# Patient Record
Sex: Male | Born: 1980 | Race: White | Hispanic: No | Marital: Single | Smoking: Never smoker
Health system: Southern US, Community
[De-identification: ages and names within clinical notes are randomized; demographics above are authoritative.]

## PROBLEM LIST (undated history)

## (undated) DIAGNOSIS — R000 Tachycardia, unspecified: Secondary | ICD-10-CM

## (undated) DIAGNOSIS — R002 Palpitations: Secondary | ICD-10-CM

## (undated) DIAGNOSIS — I10 Essential (primary) hypertension: Secondary | ICD-10-CM

## (undated) HISTORY — DX: Palpitations: R00.2

## (undated) HISTORY — DX: Tachycardia, unspecified: R00.0

## (undated) HISTORY — PX: WISDOM TOOTH EXTRACTION: SHX21

---

## 2009-11-29 ENCOUNTER — Ambulatory Visit: Payer: Self-pay | Admitting: Diagnostic Radiology

## 2009-11-29 ENCOUNTER — Emergency Department (HOSPITAL_BASED_OUTPATIENT_CLINIC_OR_DEPARTMENT_OTHER): Admission: EM | Admit: 2009-11-29 | Discharge: 2009-11-29 | Payer: Self-pay | Admitting: Emergency Medicine

## 2010-08-12 ENCOUNTER — Encounter: Admission: RE | Admit: 2010-08-12 | Discharge: 2010-08-12 | Payer: Self-pay | Admitting: Family Medicine

## 2010-10-14 ENCOUNTER — Encounter: Admission: RE | Admit: 2010-10-14 | Discharge: 2010-10-14 | Payer: Self-pay | Admitting: Specialist

## 2010-12-18 ENCOUNTER — Ambulatory Visit: Payer: Self-pay | Admitting: Cardiovascular Disease

## 2011-01-26 ENCOUNTER — Other Ambulatory Visit: Payer: Self-pay | Admitting: Ophthalmology

## 2011-01-26 DIAGNOSIS — H539 Unspecified visual disturbance: Secondary | ICD-10-CM

## 2011-02-03 ENCOUNTER — Ambulatory Visit
Admission: RE | Admit: 2011-02-03 | Discharge: 2011-02-03 | Disposition: A | Discharge status: Home / Self Care | Payer: Self-pay | Source: Ambulatory Visit | Attending: Ophthalmology | Admitting: Ophthalmology

## 2011-02-03 DIAGNOSIS — H539 Unspecified visual disturbance: Secondary | ICD-10-CM

## 2011-02-03 MED ORDER — GADOBENATE DIMEGLUMINE 529 MG/ML IV SOLN
15.0000 mL | Freq: Once | INTRAVENOUS | Status: AC | PRN
  Administered 2011-02-03: 15 mL via INTRAVENOUS

## 2011-03-15 LAB — DIFFERENTIAL
Basophils Absolute: 0 10*3/uL (ref 0.0–0.1)
Basophils Relative: 1 % (ref 0–1)
Eosinophils Absolute: 0.1 10*3/uL (ref 0.0–0.7)
Eosinophils Relative: 1 % (ref 0–5)
Lymphocytes Relative: 21 % (ref 12–46)
Lymphs Abs: 1.9 K/uL (ref 0.7–4.0)
Monocytes Absolute: 0.7 K/uL (ref 0.1–1.0)
Monocytes Relative: 8 % (ref 3–12)
Neutro Abs: 6.4 K/uL (ref 1.7–7.7)
Neutrophils Relative %: 70 % (ref 43–77)

## 2011-03-15 LAB — LIPASE, BLOOD: Lipase: 59 U/L (ref 23–300)

## 2011-03-15 LAB — URINALYSIS, ROUTINE W REFLEX MICROSCOPIC
Bilirubin Urine: NEGATIVE
Glucose, UA: NEGATIVE mg/dL
Hgb urine dipstick: NEGATIVE
Ketones, ur: NEGATIVE mg/dL
Nitrite: NEGATIVE
Protein, ur: NEGATIVE mg/dL
Specific Gravity, Urine: 1.012 (ref 1.005–1.030)
Urobilinogen, UA: 0.2 mg/dL (ref 0.0–1.0)
pH: 8 (ref 5.0–8.0)

## 2011-03-15 LAB — CBC
HCT: 45 % (ref 39.0–52.0)
Hemoglobin: 15.6 g/dL (ref 13.0–17.0)
MCHC: 34.8 g/dL (ref 30.0–36.0)
MCV: 90.6 fL (ref 78.0–100.0)
Platelets: 388 K/uL (ref 150–400)
RBC: 4.97 MIL/uL (ref 4.22–5.81)
RDW: 11.8 % (ref 11.5–15.5)
WBC: 9.1 K/uL (ref 4.0–10.5)

## 2011-03-15 LAB — COMPREHENSIVE METABOLIC PANEL WITH GFR
AST: 30 U/L (ref 0–37)
Albumin: 4.8 g/dL (ref 3.5–5.2)
BUN: 11 mg/dL (ref 6–23)
Calcium: 9.6 mg/dL (ref 8.4–10.5)
Chloride: 102 meq/L (ref 96–112)
Creatinine, Ser: 0.9 mg/dL (ref 0.4–1.5)
GFR calc Af Amer: >60 mL/min (ref >60)
Total Protein: 7.9 g/dL (ref 6.0–8.3)

## 2011-03-15 LAB — COMPREHENSIVE METABOLIC PANEL
ALT: 35 U/L (ref 0–53)
Alkaline Phosphatase: 71 U/L (ref 39–117)
CO2: 30 mEq/L (ref 19–32)
GFR calc non Af Amer: >60 mL/min (ref >60)
Glucose, Bld: 91 mg/dL (ref 70–99)
Potassium: 4.1 mEq/L (ref 3.5–5.1)
Sodium: 144 mEq/L (ref 135–145)
Total Bilirubin: 0.7 mg/dL (ref 0.3–1.2)

## 2011-08-29 IMAGING — CT CT HEAD W/O CM
5 of 6 series · 18 of 37 positions shown, 19 images · non-contrast
Comparison: None.

CT HEAD

CLINICAL DATA: Headache, neck pain, right hand tingling

CT HEAD WITHOUT CONTRAST
CT CERVICAL SPINE WITHOUT CONTRAST
TECHNIQUE: Multidetector CT imaging of the head and cervical spine
was performed following the standard protocol without intravenous
contrast.  Multiplanar CT image reconstructions of the cervical
spine were also generated.

[Series 2: head w/o · axial · non-contrast · 0.49mm/px · z∈[+73,+119]mm · 2 of 28 slices shown, 3 images]
[im 10/28  brain]
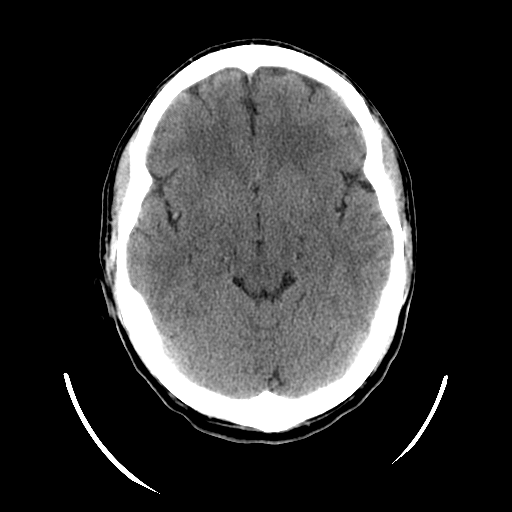
[im 10/28  bone]
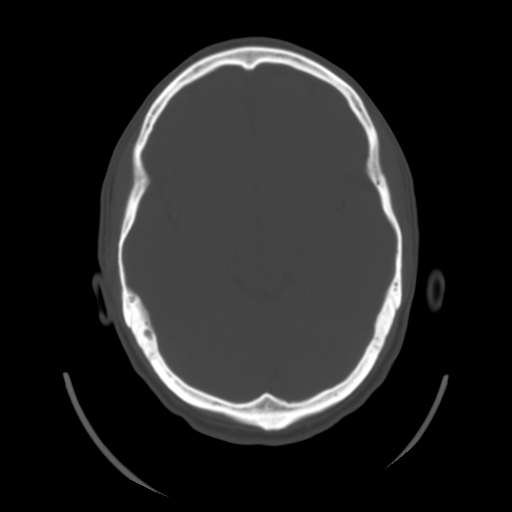
[im 19/28  brain]
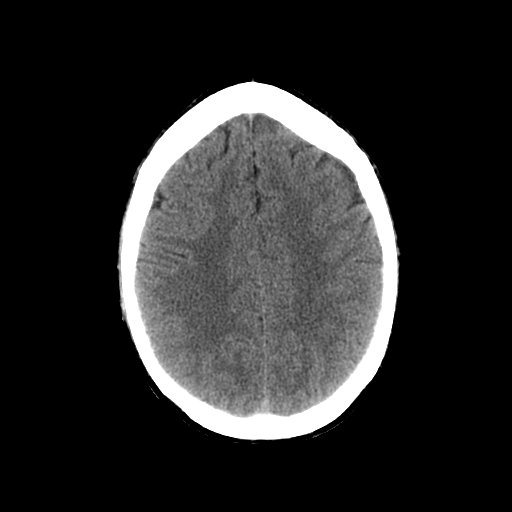

[Series 3: head bone · axial · 0.49mm/px · z∈[+73,+119]mm · 2 of 28 slices shown]
[im 10/28  bone]
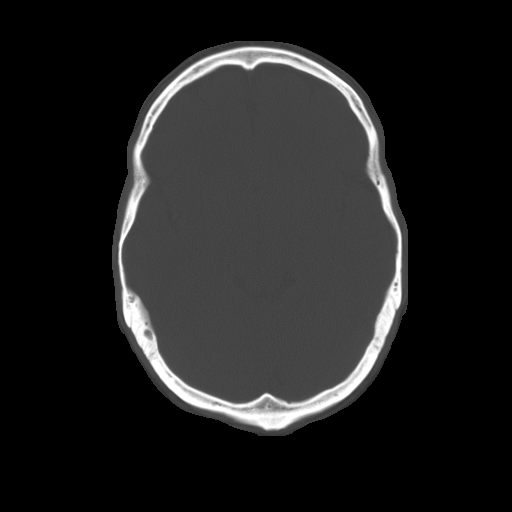
[im 19/28  bone]
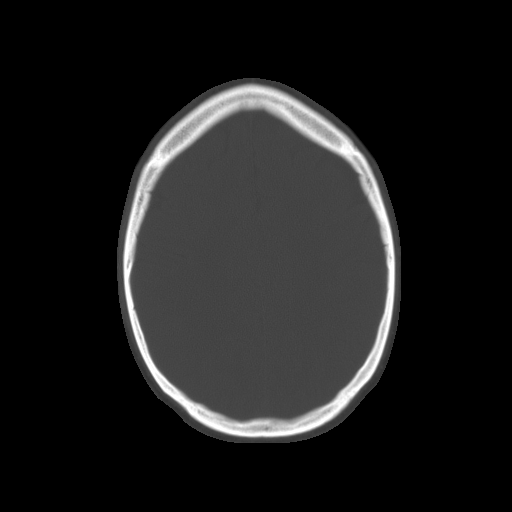

[Series 4: c spine bone · axial · 0.27mm/px · z∈[-166,-131]mm · 3 of 74 slices shown]
[im 8/74  bone]
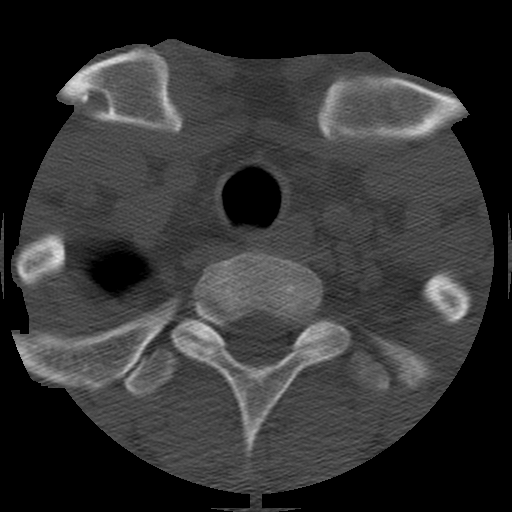
[im 15/74  bone]
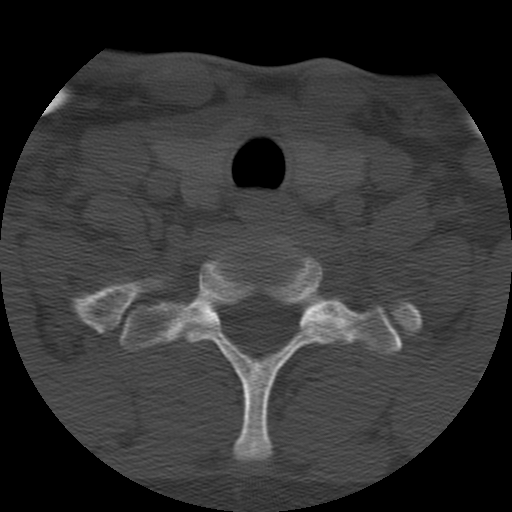
[im 22/74  bone]
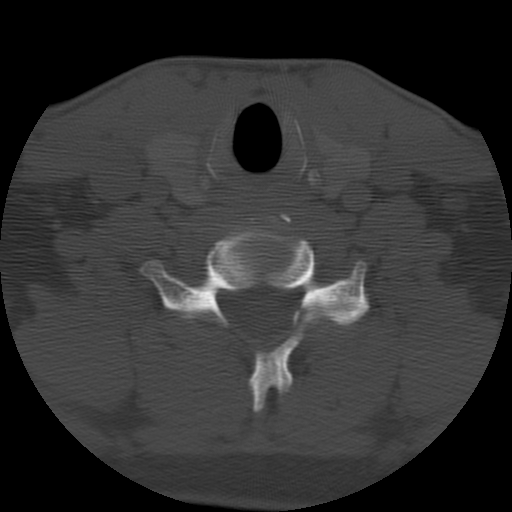

[Series 5: c spine soft · axial · 0.27mm/px · z∈[-166,-21]mm · 8 of 74 slices shown]
[im 8/74  brain]
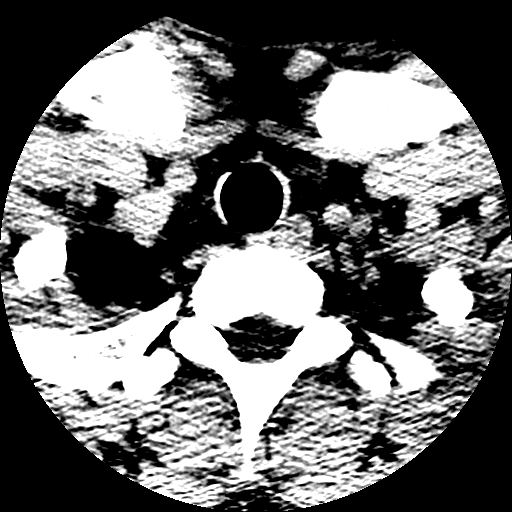
[im 15/74  brain]
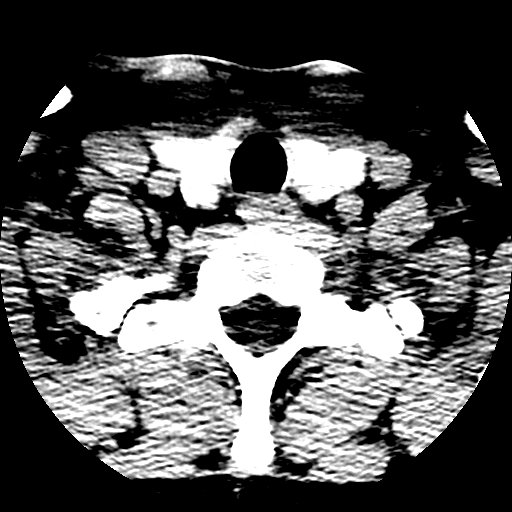
[im 22/74  brain]
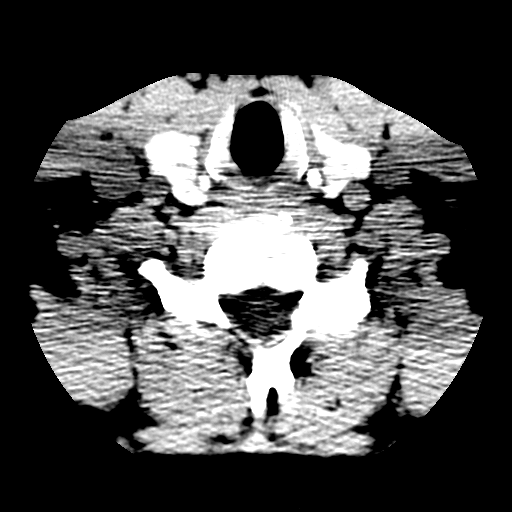
[im 30/74  brain]
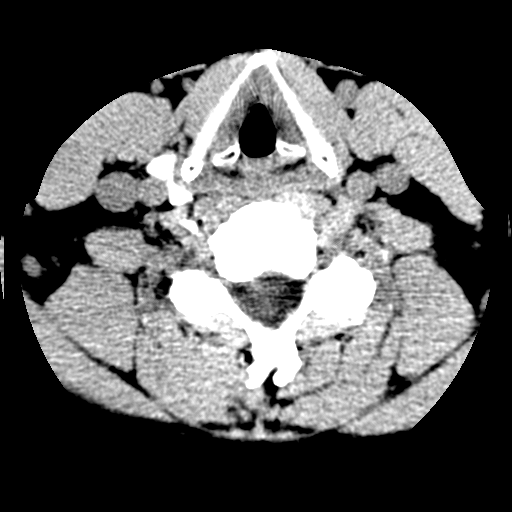
[im 44/74  brain]
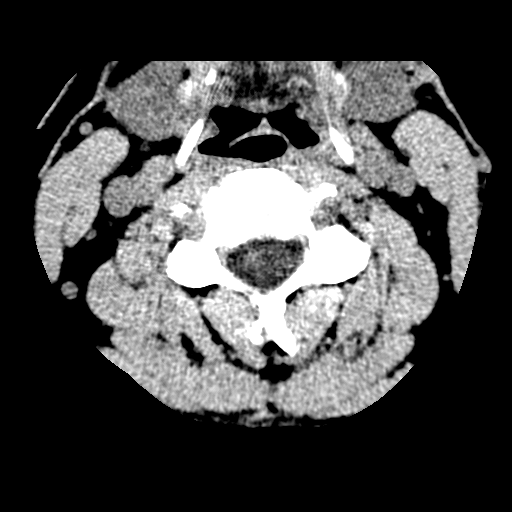
[im 52/74  brain]
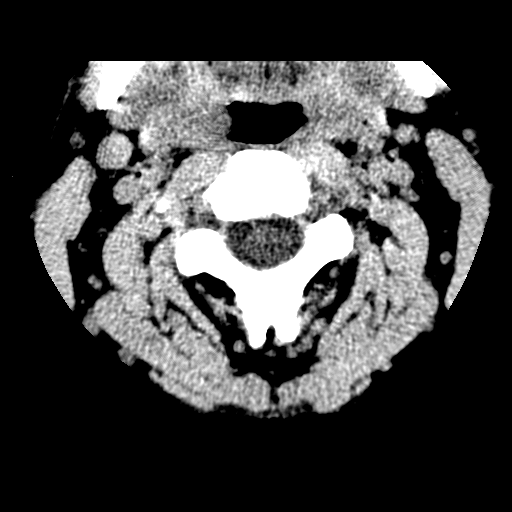
[im 59/74  brain]
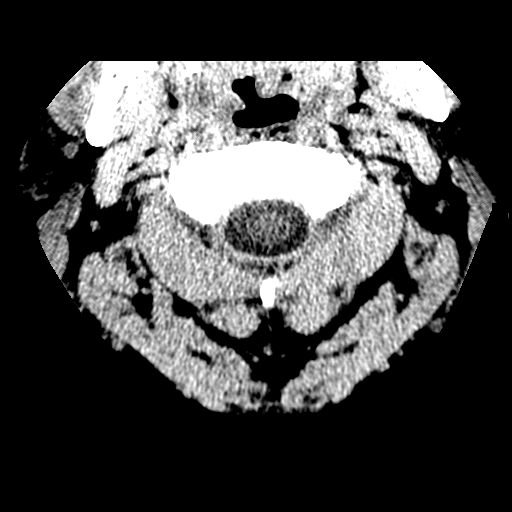
[im 66/74  brain]
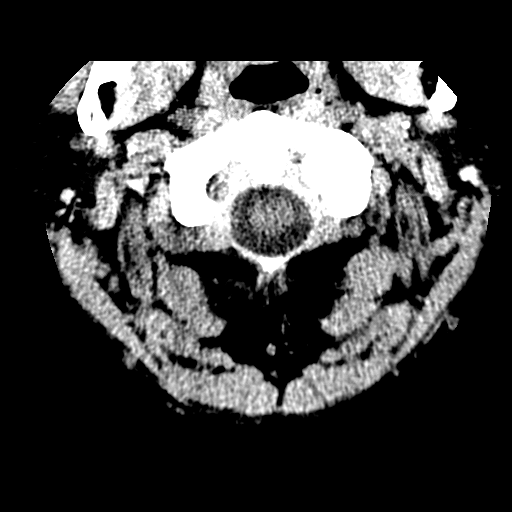

[Series 600: cor · coronal · 0.37mm/px · 3 of 37 slices shown]
[im 5/37  brain]
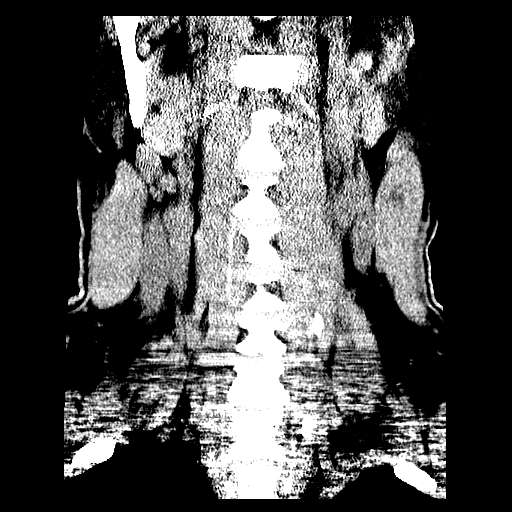
[im 9/37  brain]
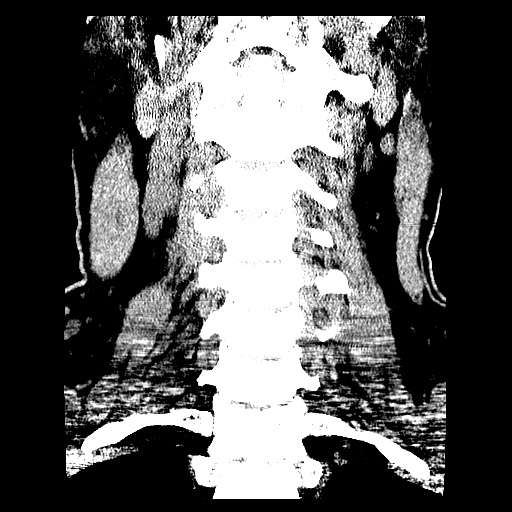
[im 12/37  brain]
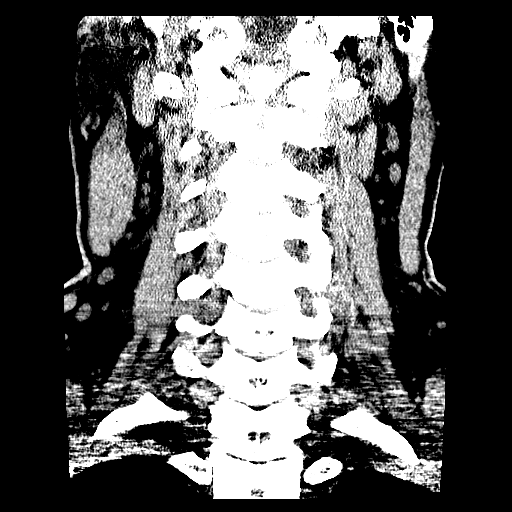

[18 of 37 positions shown; findings below may reference images not displayed]

FINDINGS: The ventricular system is normal in size and
configuration, and the septum is in a normal midline position.  The
fourth ventricle and basilar cisterns appear normal.  No
hemorrhage, masses, or acute infarction is seen.  On bone window
images, the mastoid air cells are well pneumatized, and the
paranasal sinuses are well pneumatized.  The internal auditory
canals appear symmetrical.  No calvarial abnormality is seen.
IMPRESSION: Negative unenhanced CT of the brain.

CT CERVICAL SPINE
FINDINGS: The cervical vertebrae are straightened in alignment.
Intervertebral disc spaces appear normal.  No prevertebral soft
tissue swelling is seen.  The odontoid process is intact.  No
cervical spine fracture is seen.  There is no evidence of central
canal or foraminal stenoses.  No definite herniated disc is seen by
CT.  The thyroid gland appears  normal.  No adenopathy is noted.
IMPRESSION: Straightened alignment of the cervical vertebrae.  No evidence of
degenerative disc disease.  No central canal or foraminal narrowing
is seen.

## 2012-03-21 ENCOUNTER — Encounter: Payer: Self-pay | Admitting: Cardiovascular Disease

## 2012-03-21 ENCOUNTER — Ambulatory Visit (INDEPENDENT_AMBULATORY_CARE_PROVIDER_SITE_OTHER): Payer: No Typology Code available for payment source | Admitting: Cardiovascular Disease

## 2012-03-21 VITALS — BP 135/50 | HR 74 | Ht 69.0 in | Wt 165.1 lb

## 2012-03-21 DIAGNOSIS — I10 Essential (primary) hypertension: Secondary | ICD-10-CM

## 2012-03-21 DIAGNOSIS — R002 Palpitations: Secondary | ICD-10-CM

## 2012-03-21 LAB — HEPATIC FUNCTION PANEL
Bilirubin, Direct: 0.1 mg/dL (ref 0.0–0.3)
Total Protein: 7.2 g/dL (ref 6.0–8.3)

## 2012-03-21 LAB — LIPID PANEL
HDL: 59.6 mg/dL (ref >39.00)
Triglycerides: 50 mg/dL (ref 0.0–149.0)

## 2012-03-21 LAB — BASIC METABOLIC PANEL
CO2: 28 mEq/L (ref 19–32)
Calcium: 9.1 mg/dL (ref 8.4–10.5)
Creatinine, Ser: 0.9 mg/dL (ref 0.4–1.5)

## 2012-03-21 LAB — LDL CHOLESTEROL, DIRECT: Direct LDL: 130.6 mg/dL

## 2012-03-21 MED ORDER — PROPRANOLOL HCL 10 MG PO TABS: 10.0000 mg | ORAL_TABLET | Freq: Four times a day (QID) | ORAL | Status: DC | PRN

## 2012-03-21 NOTE — Patient Instructions (Addendum)
Your physician wants you to follow-up in: 1 year You will receive a reminder letter in the mail two months in advance. If you don't receive a letter, please call our office to schedule the follow-up appointment.  Your physician recommends that you return for a FASTING lipid profile: today and in 1 year  Your physician has recommended you make the following change in your medication:   START PROPRANOLOL 10 MG TABLET FOR PALPITATIONS, TAKE ONE EVERY 30 MINUTES UP TO 4 DOSES.

## 2012-03-21 NOTE — Assessment & Plan Note (Signed)
I've refilled his propranolol 10 mg tablets. We'll see him again in one year.

## 2012-03-21 NOTE — Assessment & Plan Note (Signed)
Joseph Woodard is doing fairly well. I've asked him to start with a good low-salt diet. I've asked him to continue to exercise on a regular basis. I see him again in the office in one year for followup office visit and fasting labs.

## 2012-03-21 NOTE — Progress Notes (Signed)
    Joseph Woodard Date of Birth  Jan 02, 1981 [facility]     [place] Office  [address]    [address]   [address] [place], [place]  [address]    [place], [place]  [address] [phone]  Fax  [phone]  [phone]  Fax [phone]  Problem List: 1. Palpitations 2. Hypertension- mostly anxiety related.  History of Present Illness:  Joseph Woodard is a 31 yo with hx of palpitations.  He has had some mild elevations of his BP.  He has noticed some bilateral eye pain recently. He really doesn't pay much attention to what is eating. He typically eats a fairly normal diet for 31 year old.  Current Outpatient Prescriptions  Medication Sig Dispense Refill  . Multiple Vitamins-Minerals (MULTIVITAMIN PO) Take by mouth daily.      . propranolol (INDERAL) 10 MG tablet Take 1 tablet (10 mg total) by mouth 4 (four) times daily as needed (for palpitations ).  30 tablet  5     No Known Allergies  Past Medical History  Diagnosis Date  . Chest pain   . Palpitations     Past Surgical History  Procedure Date  . Wisdom tooth extraction     History  Smoking status  . Never Smoker   Smokeless tobacco  . Not on file    History  Alcohol Use  . Yes    Occas.    History reviewed. No pertinent family history.  Reviw of Systems:  Reviewed in the HPI.  All other systems are negative.  Physical Exam: Blood pressure 142/84, pulse 74, height 5\' 9"  (1.753 m), weight 165 lb 1.9 oz (74.898 kg). General: Well developed, well nourished, in no acute distress.  Head: Normocephalic, atraumatic, sclera non-icteric, mucus membranes are moist,   Neck: Supple. Carotids are 2 + without bruits. No JVD  Lungs: Clear bilaterally to auscultation.  Heart: regular rate.  normal  S1 S2. No murmurs, gallops or rubs.  Abdomen: Soft, non-tender, non-distended with normal bowel sounds. No hepatomegaly. No rebound/guarding. No masses.  Msk:  Strength and tone are  normal  Extremities: No clubbing or cyanosis. No edema.  Distal pedal pulses are 2+ and equal bilaterally.  Neuro: Alert and oriented X 3. Moves all extremities spontaneously.  Psych:  Responds to questions appropriately with a normal affect.  ECG: 03/21/2012-normal sinus rhythm. Right axis deviation. The EKG is otherwise normal.  Assessment / Plan:

## 2012-05-01 ENCOUNTER — Telehealth: Payer: Self-pay | Admitting: Cardiovascular Disease

## 2012-05-01 NOTE — Telephone Encounter (Signed)
lmtcb

## 2012-05-01 NOTE — Telephone Encounter (Signed)
Lmtcb//msg was left 03/27/12, this is first time he returned call.

## 2012-05-01 NOTE — Telephone Encounter (Signed)
New msg Pt wants to know blood work results

## 2012-05-01 NOTE — Telephone Encounter (Signed)
Please review phone numbers with him for update.

## 2012-05-09 NOTE — Telephone Encounter (Signed)
Fu call °Pt returning your call  °

## 2012-05-09 NOTE — Telephone Encounter (Signed)
Patient called with lab results. Pt verbalized understanding. [name] RN  

## 2012-08-25 ENCOUNTER — Encounter: Payer: Self-pay | Admitting: Cardiovascular Disease

## 2017-07-18 ENCOUNTER — Encounter: Payer: Self-pay | Admitting: Cardiovascular Disease

## 2017-08-03 ENCOUNTER — Encounter (INDEPENDENT_AMBULATORY_CARE_PROVIDER_SITE_OTHER): Payer: Self-pay

## 2017-08-03 ENCOUNTER — Encounter: Payer: Self-pay | Admitting: Cardiovascular Disease

## 2017-08-03 ENCOUNTER — Ambulatory Visit (INDEPENDENT_AMBULATORY_CARE_PROVIDER_SITE_OTHER): Payer: No Typology Code available for payment source | Admitting: Cardiovascular Disease

## 2017-08-03 VITALS — BP 160/90 | HR 85 | Ht 69.0 in | Wt 170.0 lb

## 2017-08-03 DIAGNOSIS — Z8249 Family history of ischemic heart disease and other diseases of the circulatory system: Secondary | ICD-10-CM

## 2017-08-03 DIAGNOSIS — R0602 Shortness of breath: Secondary | ICD-10-CM

## 2017-08-03 DIAGNOSIS — R002 Palpitations: Secondary | ICD-10-CM | POA: Diagnosis not present

## 2017-08-03 LAB — LIPID PANEL
CHOLESTEROL TOTAL: 201 mg/dL — AB (ref 100–199)
Chol/HDL Ratio: 2.5 ratio (ref 0.0–5.0)
HDL: 80 mg/dL (ref >39)
LDL CALC: 103 mg/dL — AB (ref 0–99)
Triglycerides: 90 mg/dL (ref 0–149)
VLDL CHOLESTEROL CAL: 18 mg/dL (ref 5–40)

## 2017-08-03 NOTE — Patient Instructions (Addendum)
Medication Instructions:  Your physician recommends that you continue on your current medications as directed. Please refer to the Current Medication list given to you today.   Labwork: TODAY - cholesterol   Testing/Procedures: Your physician has requested that you have an echocardiogram. Echocardiography is a painless test that uses sound waves to create images of your heart. It provides your doctor with information about the size and shape of your heart and how well your heart's chambers and valves are working. This procedure takes approximately one hour. There are no restrictions for this procedure.  Your physician has recommended that you wear an event monitor. Event monitors are medical devices that record the heart's electrical activity. Doctors most often [place] these monitors to diagnose arrhythmias. Arrhythmias are problems with the speed or rhythm of the heartbeat. The monitor is a small, portable device. You can wear one while you do your normal daily activities. This is usually used to diagnose what is causing palpitations/syncope (passing out).   Follow-Up: Your physician recommends that you schedule a follow-up appointment in: 3 months with Dr. Elease Hashimoto   If you need a refill on your cardiac medications before your next appointment, please call your pharmacy.   Thank you for choosing [facility]! Eligha Bridegroom, RN [phone]

## 2017-08-03 NOTE — Progress Notes (Signed)
Cardiology Office Note:    Date:  08/03/2017   ID:  [name], DOB 05-05-1981, MRN [id number]  PCP:  [facility] [place]  Cardiologist:  Kristeen Miss, MD    Referring MD: Joycelyn Rua, MD   1.  Palpitations 2. Family hx of CAD  Chief Complaint  Patient presents with  . New Patient (Initial Visit)    fatigue/hx of MI     History of Present Illness:    Joseph Woodard is a 36 y.o. male with a hx of palpitations. 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    Rides the [employer] twice a week.   No palps with exercise   palps occur in the evening .   Last 20 seconds Typically has several episodes per hour. ( sounds like PVCs)  LFTs are mildly elevated.   - drinks a decent amount of ETOH.   Past Medical History:  Diagnosis Date  . Chest pain   . Palpitations   . Tachycardia     Past Surgical History:  Procedure Laterality Date  . WISDOM TOOTH EXTRACTION      Current Medications: Current Meds  Medication Sig  . Multiple Vitamins-Minerals (MULTIVITAMIN PO) Take by mouth daily.     Allergies:   Patient has no known allergies.   Social History   Social History  . Marital status: Single    Spouse name: N/A  . Number of children: N/A  . Years of education: N/A   Social History Main Topics  . Smoking status: Never Smoker  . Smokeless tobacco: Never Used  . Alcohol use Yes     Comment: Occas.  . Drug use: No  . Sexual activity: Not Asked   Other Topics Concern  . None   Social History Narrative  . None     Family History: The patient's family history includes Heart attack in his maternal grandmother; Heart attack (age of onset: 25) in his father; Heart disease in his father and maternal grandmother; Parkinson's disease in his mother. ROS:   Please see the history of present illness.     All other systems reviewed and are negative.  EKGs/Labs/Other  Studies Reviewed:    EKG:  EKG is  ordered today.  The ekg ordered today demonstrates  NSR at 85.   No St or T wave changes.   Recent Labs: No results found for requested labs within last 8760 hours.  Recent Lipid Panel    Component Value Date/Time   CHOL 201 (H) 03/21/2012 1015   TRIG 50.0 03/21/2012 1015   HDL 59.60 03/21/2012 1015   CHOLHDL 3 03/21/2012 1015   VLDL 10.0 03/21/2012 1015   LDLDIRECT 130.6 03/21/2012 1015    Physical Exam:    VS:  BP (!) 160/90   Pulse 85   Ht 5\' 9"  (1.753 m)   Wt 170 lb (77.1 kg)   BMI 25.10 kg/m     Wt Readings from Last 3 Encounters:  08/03/17 170 lb (77.1 kg)  03/21/12 165 lb 1.9 oz (74.9 kg)     GEN:  Well nourished, well developed in no acute distress HEENT: Normal NECK: No JVD; No carotid bruits LYMPHATICS: No lymphadenopathy CARDIAC: RRR, no murmurs, rubs, gallops RESPIRATORY:  Clear to auscultation without rales, wheezing or rhonchi  ABDOMEN: Soft, non-tender, non-distended MUSCULOSKELETAL:  No edema; No deformity  SKIN: Warm and dry NEUROLOGIC:  Alert and  oriented x 3 PSYCHIATRIC:  Normal affect   ASSESSMENT:    1. SOB (shortness of breath)   2. Family history of early CAD   3. Palpitations    PLAN:    In order of problems listed above:  1. Palpitations: Success presents with palpitations that clinically sound like premature ventricular contractions. They occur at various times throughout the month.  He has occasional episodes of shortness of breath that seemed to be a little bit worse than they were before.  We'll place a 30 day event monitor for further evaluation of these palpitations. We'll get a neck car gram to assess his left ventricular function and valvular function.  His medical doctor has checked labs revealing a normal TSH and normal potassium level but did not check a lipid level. He has a very strong family history of any artery disease a we'll check lipid level today.  I'll see him again in 3 months  for follow-up visit.  Medication Adjustments/Labs and Tests Ordered: Current medicines are reviewed at length with the patient today.  Concerns regarding medicines are outlined above.  Orders Placed This Encounter  Procedures  . Lipid Profile  . Cardiac event monitor  . EKG 12-Lead  . ECHOCARDIOGRAM COMPLETE   No orders of the defined types were placed in this encounter.   Signed, Kristeen Miss, MD  08/03/2017 10:39 AM    [facility]

## 2017-08-23 ENCOUNTER — Other Ambulatory Visit: Payer: Self-pay

## 2017-08-23 ENCOUNTER — Ambulatory Visit (INDEPENDENT_AMBULATORY_CARE_PROVIDER_SITE_OTHER): Payer: No Typology Code available for payment source

## 2017-08-23 ENCOUNTER — Ambulatory Visit (HOSPITAL_COMMUNITY): Payer: No Typology Code available for payment source | Attending: Cardiology

## 2017-08-23 DIAGNOSIS — R002 Palpitations: Secondary | ICD-10-CM | POA: Diagnosis not present

## 2017-08-23 DIAGNOSIS — R0602 Shortness of breath: Secondary | ICD-10-CM | POA: Diagnosis present

## 2017-11-09 ENCOUNTER — Telehealth: Payer: Self-pay | Admitting: Cardiovascular Disease

## 2017-11-09 NOTE — Telephone Encounter (Signed)
Patient's mother called to reschedule due to insurance reasons. Patient is rescheduled to see Dr. Elease HashimotoNahser on 01/20/18. She is in agreement with plan and thanked me for the call.

## 2017-11-09 NOTE — Telephone Encounter (Signed)
New message     Does patient need to keep appt on Monday 12/3 he was told all his tests were normal, if he does need this appt he need to move it to January due to insurance reasons

## 2017-11-14 ENCOUNTER — Ambulatory Visit: Payer: No Typology Code available for payment source | Admitting: Cardiovascular Disease

## 2018-01-20 ENCOUNTER — Ambulatory Visit: Payer: No Typology Code available for payment source | Admitting: Cardiovascular Disease

## 2018-06-06 ENCOUNTER — Other Ambulatory Visit (HOSPITAL_BASED_OUTPATIENT_CLINIC_OR_DEPARTMENT_OTHER): Payer: Self-pay | Admitting: Physician Assistant

## 2018-06-06 DIAGNOSIS — R1084 Generalized abdominal pain: Secondary | ICD-10-CM

## 2018-06-07 ENCOUNTER — Ambulatory Visit (HOSPITAL_BASED_OUTPATIENT_CLINIC_OR_DEPARTMENT_OTHER)
Admission: RE | Admit: 2018-06-07 | Discharge: 2018-06-07 | Disposition: A | Discharge status: Home / Self Care | Payer: No Typology Code available for payment source | Source: Ambulatory Visit | Attending: Physician Assistant | Admitting: Physician Assistant

## 2018-06-07 DIAGNOSIS — R1084 Generalized abdominal pain: Secondary | ICD-10-CM

## 2018-06-07 DIAGNOSIS — K760 Fatty (change of) liver, not elsewhere classified: Secondary | ICD-10-CM | POA: Diagnosis not present

## 2020-07-03 ENCOUNTER — Other Ambulatory Visit: Payer: Self-pay | Admitting: Family Medicine

## 2020-07-04 ENCOUNTER — Other Ambulatory Visit: Payer: Self-pay | Admitting: Family Medicine

## 2020-07-04 DIAGNOSIS — R109 Unspecified abdominal pain: Secondary | ICD-10-CM

## 2020-07-04 DIAGNOSIS — R195 Other fecal abnormalities: Secondary | ICD-10-CM

## 2020-07-21 ENCOUNTER — Other Ambulatory Visit: Payer: No Typology Code available for payment source

## 2020-07-29 ENCOUNTER — Other Ambulatory Visit: Payer: 59

## 2020-08-08 ENCOUNTER — Ambulatory Visit
Admission: RE | Admit: 2020-08-08 | Discharge: 2020-08-08 | Disposition: A | Discharge status: Home / Self Care | Payer: 59 | Source: Ambulatory Visit | Attending: Family Medicine | Admitting: Family Medicine

## 2020-08-08 DIAGNOSIS — R109 Unspecified abdominal pain: Secondary | ICD-10-CM

## 2020-08-08 DIAGNOSIS — R195 Other fecal abnormalities: Secondary | ICD-10-CM

## 2020-08-08 MED ORDER — IOPAMIDOL (ISOVUE-300) INJECTION 61%
100.0000 mL | Freq: Once | INTRAVENOUS | Status: AC | PRN
  Administered 2020-08-08: 100 mL via INTRAVENOUS

## 2021-05-26 ENCOUNTER — Emergency Department (HOSPITAL_BASED_OUTPATIENT_CLINIC_OR_DEPARTMENT_OTHER)
Admission: EM | Admit: 2021-05-26 | Discharge: 2021-05-27 | Disposition: A | Discharge status: Home / Self Care | Payer: 59 | Attending: Emergency Medicine | Admitting: Emergency Medicine

## 2021-05-26 ENCOUNTER — Other Ambulatory Visit: Payer: Self-pay

## 2021-05-26 DIAGNOSIS — R5383 Other fatigue: Secondary | ICD-10-CM | POA: Insufficient documentation

## 2021-05-26 DIAGNOSIS — R079 Chest pain, unspecified: Secondary | ICD-10-CM | POA: Insufficient documentation

## 2021-05-26 DIAGNOSIS — M542 Cervicalgia: Secondary | ICD-10-CM | POA: Insufficient documentation

## 2021-05-26 DIAGNOSIS — I10 Essential (primary) hypertension: Secondary | ICD-10-CM | POA: Insufficient documentation

## 2021-05-26 DIAGNOSIS — R0602 Shortness of breath: Secondary | ICD-10-CM | POA: Diagnosis not present

## 2021-05-26 DIAGNOSIS — Z79899 Other long term (current) drug therapy: Secondary | ICD-10-CM | POA: Insufficient documentation

## 2021-05-26 HISTORY — DX: Essential (primary) hypertension: I10

## 2021-05-27 ENCOUNTER — Other Ambulatory Visit: Payer: Self-pay

## 2021-05-27 ENCOUNTER — Encounter (HOSPITAL_BASED_OUTPATIENT_CLINIC_OR_DEPARTMENT_OTHER): Payer: Self-pay | Admitting: *Deleted

## 2021-05-27 ENCOUNTER — Emergency Department (HOSPITAL_BASED_OUTPATIENT_CLINIC_OR_DEPARTMENT_OTHER): Payer: 59 | Admitting: Radiology

## 2021-05-27 LAB — BASIC METABOLIC PANEL
Anion gap: 14 (ref 5–15)
BUN: 8 mg/dL (ref 6–20)
CO2: 23 mmol/L (ref 22–32)
Calcium: 9.2 mg/dL (ref 8.9–10.3)
Chloride: 103 mmol/L (ref 98–111)
Creatinine, Ser: 0.64 mg/dL (ref 0.61–1.24)
GFR, Estimated: >60 mL/min (ref >60)
Glucose, Bld: 98 mg/dL (ref 70–99)
Potassium: 3.7 mmol/L (ref 3.5–5.1)
Sodium: 140 mmol/L (ref 135–145)

## 2021-05-27 LAB — CBC WITH DIFFERENTIAL/PLATELET
Abs Immature Granulocytes: 0.06 10*3/uL (ref 0.00–0.07)
Basophils Absolute: 0.1 10*3/uL (ref 0.0–0.1)
Basophils Relative: 1 %
Eosinophils Absolute: 0.2 10*3/uL (ref 0.0–0.5)
Eosinophils Relative: 2 %
HCT: 41.9 % (ref 39.0–52.0)
Hemoglobin: 15.1 g/dL (ref 13.0–17.0)
Immature Granulocytes: 1 %
Lymphocytes Relative: 27 %
Lymphs Abs: 2.7 10*3/uL (ref 0.7–4.0)
MCH: 32.4 pg (ref 26.0–34.0)
MCHC: 36 g/dL (ref 30.0–36.0)
MCV: 89.9 fL (ref 80.0–100.0)
Monocytes Absolute: 1.2 10*3/uL — ABNORMAL HIGH (ref 0.1–1.0)
Monocytes Relative: 13 %
Neutro Abs: 5.5 10*3/uL (ref 1.7–7.7)
Neutrophils Relative %: 56 %
Platelets: 325 10*3/uL (ref 150–400)
RBC: 4.66 MIL/uL (ref 4.22–5.81)
RDW: 11.4 % — ABNORMAL LOW (ref 11.5–15.5)
WBC: 9.7 10*3/uL (ref 4.0–10.5)
nRBC: 0 % (ref 0.0–0.2)

## 2021-05-27 LAB — TROPONIN I (HIGH SENSITIVITY): Troponin I (High Sensitivity): 2 ng/L (ref <18)

## 2021-05-27 LAB — D-DIMER, QUANTITATIVE: D-Dimer, Quant: <0.27 ug/mL-FEU (ref 0.00–0.50)

## 2021-05-27 NOTE — ED Triage Notes (Signed)
Pt c/o anterior chest pain that started one week ago. Was evaluated by EMS-Ekg done-and pt followed up with his MD this past week. Describes pain as sharp and comes and goes. Has not taken meds for same. Also c/o posterior neck pain that radiates into his posterior aspect of his head. C/o dizziness when he was leaning over. Denies sob.

## 2021-05-27 NOTE — Discharge Instructions (Addendum)
Follow-up with your primary care doctor or cardiologist.  Return here as needed for any worsening symptoms.

## 2021-05-27 NOTE — ED Provider Notes (Signed)
[facility] EMERGENCY DEPT Provider Note   CSN: [id number] Arrival date & time: 05/26/21  2350     History Chief Complaint  Patient presents with   Chest Pain    Joseph Woodard is a 40 y.o. male.  Patient is a 40 year old male who presents with chest pain.  He states that about 5 days ago he had an episode of sharp chest pain to the left parasternal area.  It lasted about 20 minutes or so.  He he had some associated shortness of breath but he was anxious at the time to and does not know if the shortness of breath was more from the anxiety.  EMS was called and said that his EKG looked okay but recommended transport to the ED.  He declined at that time but followed up with his PCP the next day.  He was noted to have some elevation in his blood pressure and was started on antihypertensive medication.  Since that time he has had some very mild intermittent chest pains around his chest but not in the same location.  He currently denies any chest pain.  He says he has felt fatigued for about the last month.  At times he feels short of breath when he is climbing stairs or other such things.  He denies any current shortness of breath.  He had some pain in his neck over the last couple days.  Its in the right side of his neck.  It starts at the base of his head and radiates down toward his right shoulder.  There is no radiation down his arm.  No numbness or weakness in his hand.  He says he mostly notices it when he is laying down or sitting in a recliner chair or laying back.  When he sits up and walks around, it goes away.  No pain around his shoulder blades or other parts of his back.  No leg pain or swelling.  No cough or cold symptoms.  No prior history of heart disease.  He was seen by cardiology in 2018 for palpitations and was briefly started on metoprolol but subsequently discontinued it.  He does have a family history of early heart disease in his father who had a heart attack at age  57.      Past Medical History:  Diagnosis Date   Chest pain    Hypertension    Palpitations    Tachycardia     Patient Active Problem List   Diagnosis Date Noted   Palpitations 03/21/2012   HTN (hypertension) 03/21/2012    Past Surgical History:  Procedure Laterality Date   WISDOM TOOTH EXTRACTION         Family History  Problem Relation Age of Onset   Parkinson's disease Mother    Heart attack Father 34   Heart disease Father    Heart attack Maternal Grandmother    Heart disease Maternal Grandmother     Social History   Tobacco Use   Smoking status: Never   Smokeless tobacco: Never  Vaping Use   Vaping Use: Never used  Substance Use Topics   Alcohol use: Yes    Alcohol/week: 3.0 standard drinks    Types: 3 Cans of beer per week    Comment: 3 times weekly   Drug use: No    Home Medications Prior to Admission medications   Medication Sig Start Date End Date Taking? Authorizing Provider  AMLODIPINE BESYLATE PO Take by mouth.   Yes [provider]  Multiple Vitamins-Minerals (MULTIVITAMIN PO) Take by mouth daily.    [provider]    Allergies    Patient has no known allergies.  Review of Systems   Review of Systems  Constitutional:  Negative for chills, diaphoresis, fatigue and fever.  HENT:  Negative for congestion, rhinorrhea and sneezing.   Eyes: Negative.   Respiratory:  Positive for shortness of breath. Negative for cough and chest tightness.   Cardiovascular:  Positive for chest pain. Negative for leg swelling.  Gastrointestinal:  Negative for abdominal pain, blood in stool, diarrhea, nausea and vomiting.  Genitourinary:  Negative for difficulty urinating, flank pain, frequency and hematuria.  Musculoskeletal:  Positive for neck pain. Negative for arthralgias and back pain.  Skin:  Negative for rash.  Neurological:  Negative for dizziness, speech difficulty, weakness, numbness and headaches.   Physical Exam Updated Vital  Signs BP (!) 125/99   Pulse 98   Temp 98.3 F (36.8 C) (Oral)   Resp (!) 21   Ht 5\' 9"  (1.753 m)   Wt 77.1 kg   SpO2 98%   BMI 25.10 kg/m   Physical Exam Constitutional:      Appearance: He is well-developed.  HENT:     Head: Normocephalic and atraumatic.  Eyes:     Pupils: Pupils are equal, round, and reactive to light.  Neck:     Comments: Minor tenderness to the right paraspinal area in the cervical region.  No bony tenderness.  No pain over the trapezius muscle.  He has normal sensation and motor function to the upper extremities bilaterally.  Radial pulses are intact. Cardiovascular:     Rate and Rhythm: Normal rate and regular rhythm.     Heart sounds: Normal heart sounds.  Pulmonary:     Effort: Pulmonary effort is normal. No respiratory distress.     Breath sounds: Normal breath sounds. No wheezing or rales.  Chest:     Chest wall: No tenderness.  Abdominal:     General: Bowel sounds are normal.     Palpations: Abdomen is soft.     Tenderness: There is no abdominal tenderness. There is no guarding or rebound.  Musculoskeletal:        General: Normal range of motion.     Cervical back: Normal range of motion and neck supple.  Lymphadenopathy:     Cervical: No cervical adenopathy.  Skin:    General: Skin is warm and dry.     Findings: No rash.  Neurological:     Mental Status: He is alert and oriented to person, place, and time.    ED Results / Procedures / Treatments   Labs (all labs ordered are listed, but only abnormal results are displayed) Labs Reviewed  CBC WITH DIFFERENTIAL/PLATELET - Abnormal; Notable for the following components:      Result Value   RDW 11.4 (*)    Monocytes Absolute 1.2 (*)    All other components within normal limits  BASIC METABOLIC PANEL  D-DIMER, QUANTITATIVE  TROPONIN I (HIGH SENSITIVITY)    EKG EKG Interpretation  Date/Time:  Wednesday May 27 2021 00:03:17 EDT Ventricular Rate:  105 PR Interval:  120 QRS  Duration: 102 QT Interval:  338 QTC Calculation: 446 R Axis:   114 Text Interpretation: Sinus tachycardia Left posterior fascicular block Abnormal ECG Confirmed by [phone] on 05/27/2021 12:37:59 AM  Radiology DG Chest 2 View  Result Date: 05/27/2021 CLINICAL DATA:  Chest pain starting 1 week ago. EXAM:  CHEST - 2 VIEW COMPARISON:  None. FINDINGS: The heart size and mediastinal contours are within normal limits. Both lungs are clear. The visualized skeletal structures are unremarkable. IMPRESSION: No active cardiopulmonary disease. Electronically Signed   By: Burman Nieves M.D.   On: 05/27/2021 01:21    Procedures Procedures   Medications Ordered in ED Medications - No data to display  ED Course  I have reviewed the triage vital signs and the nursing notes.  Pertinent labs & imaging results that were available during my care of the patient were reviewed by me and considered in my medical decision making (see chart for details).    MDM Rules/Calculators/A&P                          Patient is a 40 year old male who presented with chest pain.  His main episode was thought 5 days ago but he has had some intermittent pain since then.  He has no exertional symptoms.  He was concerned because he had some right-sided neck pain.  Its intermittent.  It seems to be positional.  He has no neurologic deficits.  Seems to be related to his cervical spine.  He does not have any other symptoms that sound more concerning for dissection.  His D-dimer is negative and he has no other symptoms that would be more concerning for PE.  He had no ischemic changes on his EKG.  Chest x-ray is clear without evidence of pneumothorax or pneumonia.  No pulmonary edema.  His labs are nonconcerning.  His troponin is negative.  Given the timing of his symptoms, I did not feel that a repeat troponin is indicated.  His primary care provider has seen him this week for chest pain and referred him to cardiology.   They are supposed to be scheduling him an appointment.  He was discharged home in good condition.  Return precautions were given. Final Clinical Impression(s) / ED Diagnoses Final diagnoses:  Chest pain, unspecified type    Rx / DC Orders ED Discharge Orders     None        Rolan Bucco, MD 05/27/21 [phone]

## 2021-06-24 ENCOUNTER — Ambulatory Visit (INDEPENDENT_AMBULATORY_CARE_PROVIDER_SITE_OTHER): Payer: 59

## 2021-06-24 ENCOUNTER — Encounter: Payer: Self-pay | Admitting: Orthopaedic Surgery

## 2021-06-24 ENCOUNTER — Other Ambulatory Visit: Payer: Self-pay

## 2021-06-24 ENCOUNTER — Ambulatory Visit (INDEPENDENT_AMBULATORY_CARE_PROVIDER_SITE_OTHER): Payer: 59 | Admitting: Orthopaedic Surgery

## 2021-06-24 DIAGNOSIS — M542 Cervicalgia: Secondary | ICD-10-CM | POA: Diagnosis not present

## 2021-06-24 DIAGNOSIS — M25511 Pain in right shoulder: Secondary | ICD-10-CM

## 2021-06-24 MED ORDER — METHOCARBAMOL 500 MG PO TABS: 500.0000 mg | ORAL_TABLET | Freq: Four times a day (QID) | ORAL | 1 refills | Status: AC | PRN

## 2021-06-24 MED ORDER — METHYLPREDNISOLONE ACETATE 40 MG/ML IJ SUSP
40.0000 mg | INTRAMUSCULAR | Status: AC | PRN
  Administered 2021-06-24: 40 mg via INTRAMUSCULAR

## 2021-06-24 MED ORDER — MELOXICAM 7.5 MG PO TABS: 7.5000 mg | ORAL_TABLET | Freq: Two times a day (BID) | ORAL | 0 refills | Status: DC | PRN

## 2021-06-24 MED ORDER — LIDOCAINE HCL 1 % IJ SOLN
1.0000 mL | INTRAMUSCULAR | Status: AC | PRN
  Administered 2021-06-24: 1 mL

## 2021-06-24 NOTE — Progress Notes (Signed)
Office Visit Note   Patient: Joseph Woodard           Date of Birth: 1981/04/07           MRN: [id number] Visit Date: 06/24/2021              Requested by: Joycelyn Rua, MD [address] [place],  [place] [address] PCP: Joycelyn Rua, MD   Assessment & Plan: Visit Diagnoses:  1. Neck pain   2. Trigger point of right shoulder region     Plan: I spoke to him in length about what is going on with his neck.  I did feel that a trigger point injection would be worthwhile with a steroid and I explained the rationale behind this as well as the risk and benefits.  I did place a steroid injection in the right parascapular area which he tolerated well.  I would like to try Robaxin and meloxicam.  He does not have time for physical therapy at the moment due to things going on his life but that is something consider later.  All questions and concerns were answered and addressed.  I would like to see him back in 4 weeks.  Follow-Up Instructions: Return in about 4 weeks (around 07/22/2021).   Orders:  Orders Placed This Encounter  Procedures   Trigger Point Inj   XR Cervical Spine 2 or 3 views   Meds ordered this encounter  Medications   methocarbamol (ROBAXIN) 500 MG tablet    Sig: Take 1 tablet (500 mg total) by mouth every 6 (six) hours as needed.    Dispense:  40 tablet    Refill:  1   meloxicam (MOBIC) 7.5 MG tablet    Sig: Take 1 tablet (7.5 mg total) by mouth 2 (two) times daily between meals as needed for pain.    Dispense:  60 tablet    Refill:  0      Procedures: Trigger Point Inj  Date/Time: 06/24/2021 10:45 AM Performed by: Kathryne Hitch, MD Authorized by: Kathryne Hitch, MD   Indications:  Pain Total # of Trigger Points:  1 Location: shoulder   Medications #1:  1 mL lidocaine 1 %; 40 mg methylPREDNISolone acetate 40 MG/ML   Clinical Data: No additional findings.   Subjective: Chief Complaint  Patient presents with   Neck - Pain   The patient is a very pleasant 40 year old gentleman who comes in with 5 years of worsening chronic right-sided neck pain.  He denies any numbness and tingling going down to his hand but it does radiate slightly into the shoulder but is more in the occipital area and the lateral aspect of his right neck where he gets stiff and painful.  It happens most at night.  He denies any specific injuries.  He says it may be work-related in terms of spending a long amount of time on a computer.  He can be nausea related as well and even dealing with life stressors.  He does have a sister with stage IV cancer and a mother who is dealing with Parkinson's disease and both of those people are hospitalized.  He does report an odd numbness and tingling that occurs occasionally and he points to the sternocleidomastoid area as the area of his pain.  He denies any breathing issues.  He is not a smoker and not a diabetic.  HPI  Review of Systems There is currently listed no headache, chest pain, shortness of breath, fever,  chills, nausea, vomiting  Objective: Vital Signs: There were no vitals taken for this visit.  Physical Exam There is no acute distress on exam.  He is alert and orient x3. Ortho Exam On examination he does have stiffness with lateral rotation and bending to the right side.  He has a negative Spurling sign.  He has 5 out of 5 strength of all muscle groups of the bilateral upper extremities and normal sensation in all dermatomes.  He does have trigger point pain just posterior to the mid trapezius area.  There is a radicular component of this as well. Specialty Comments:  No specialty comments available.  Imaging: XR Cervical Spine 2 or 3 views  Result Date: 06/24/2021 2 views of the cervical spine show loss of cervical lordosis.  The disc heights are otherwise well-maintained.  There is no acute findings other than the loss of cervical lordosis.    PMFS History: Patient Active Problem List    Diagnosis Date Noted   Palpitations 03/21/2012   HTN (hypertension) 03/21/2012   Past Medical History:  Diagnosis Date   Chest pain    Hypertension    Palpitations    Tachycardia     Family History  Problem Relation Age of Onset   Parkinson's disease Mother    Heart attack Father 53   Heart disease Father    Heart attack Maternal Grandmother    Heart disease Maternal Grandmother     Past Surgical History:  Procedure Laterality Date   WISDOM TOOTH EXTRACTION     Social History   Occupational History   Not on file  Tobacco Use   Smoking status: Never   Smokeless tobacco: Never  Vaping Use   Vaping Use: Never used  Substance and Sexual Activity   Alcohol use: Yes    Alcohol/week: 3.0 standard drinks    Types: 3 Cans of beer per week    Comment: 3 times weekly   Drug use: No   Sexual activity: Not on file

## 2021-07-07 ENCOUNTER — Ambulatory Visit: Payer: 59 | Admitting: Cardiology

## 2021-07-18 ENCOUNTER — Other Ambulatory Visit: Payer: Self-pay | Admitting: Orthopaedic Surgery

## 2021-07-20 NOTE — Telephone Encounter (Signed)
ok 

## 2021-07-22 ENCOUNTER — Ambulatory Visit: Payer: 59 | Admitting: Orthopaedic Surgery

## 2021-08-12 ENCOUNTER — Encounter: Payer: Self-pay | Admitting: Orthopaedic Surgery

## 2021-08-12 ENCOUNTER — Ambulatory Visit (INDEPENDENT_AMBULATORY_CARE_PROVIDER_SITE_OTHER): Payer: 59 | Admitting: Orthopaedic Surgery

## 2021-08-12 DIAGNOSIS — M25511 Pain in right shoulder: Secondary | ICD-10-CM | POA: Diagnosis not present

## 2021-08-12 DIAGNOSIS — M542 Cervicalgia: Secondary | ICD-10-CM

## 2021-08-12 NOTE — Progress Notes (Signed)
Office Visit Note   Patient: Joseph Woodard           Date of Birth: 12-09-1981           MRN: [id number] Visit Date: 08/12/2021              Requested by: Joycelyn Rua, MD [address] [place],  [place] [address] PCP: Joycelyn Rua, MD   Assessment & Plan: Visit Diagnoses:  1. Neck pain   2. Trigger point of right shoulder region     Plan: We will hold off on repeat injection at this point time.  He is agreeable to trying to get daily oral therapy.  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  We will send him to physical therapy for range of motion and neck shoulders, modalities and home exercise program.  He may need a repeat trigger point injection in the future and he will let us know.  Questions were encouraged and answered at length.  Follow-Up Instructions: No follow-ups on file.   Orders:  No orders of the defined types were placed in this encounter.  No orders of the defined types were placed in this encounter.     Procedures: No procedures performed   Clinical Data: No additional findings.   Subjective: Chief Complaint  Patient presents with   Neck - Follow-up    HPI Joseph Woodard is a 40 year old male who is following up for right shoulder trigger point pain and neck pain.  He does feel the trigger point injection on 06/24/2021 was very beneficial.  He is having some hurting again in the shoulder but not to the intensity he was having prior to the injection.  Since he was seen his sister did pass away.  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  Denies any numbness tingling down either arm.  Discussed with him the importance of ergonomics and his work as he does spend a lot of time on a computer. Review of Systems See HPI otherwise negative  Objective: Vital Signs: There were no vitals taken for  this visit.  Physical Exam General: Well-developed well-nourished male no acute distress mood and affect affect appropriate Psych: Alert and oriented x3 Ortho Exam Cervical spine is good range of motion cervical spine with rotation to the left causes pain in the right trapezius region.  He has a point of maximal tenderness palpation in the trapezius region of the right shoulder only. Specialty Comments:  No specialty comments available.  Imaging: No results found.   PMFS History: Patient Active Problem List   Diagnosis Date Noted   Palpitations 03/21/2012   HTN (hypertension) 03/21/2012   Past Medical History:  Diagnosis Date   Chest pain    Hypertension    Palpitations    Tachycardia     Family History  Problem Relation Age of Onset   Parkinson's disease Mother    Heart attack Father 62   Heart disease Father    Heart attack Maternal Grandmother    Heart disease Maternal Grandmother     Past Surgical History:  Procedure Laterality Date   WISDOM TOOTH EXTRACTION     Social History   Occupational History   Not on file  Tobacco Use   Smoking status: Never   Smokeless tobacco: Never  Vaping Use   Vaping Use: Never used  Substance and Sexual Activity   Alcohol use: Yes    Alcohol/week: 3.0 standard drinks    Types: 3 Cans of beer per week    Comment: 3 times weekly   Drug use: No   Sexual activity: Not on file

## 2021-09-08 NOTE — Progress Notes (Deleted)
Cardiology Office Note:    Date:  09/08/2021   ID:  Joseph Woodard, DOB 1981/04/09, MRN [id number]  PCP:  Joycelyn Rua, MD  Cardiologist:  None   Referring MD: Sheliah Hatch, PA-C   No chief complaint on file.   History of Present Illness:    Joseph Woodard is a 40 y.o. male with a hx of chest pain. No data available from the referring.   ***  Past Medical History:  Diagnosis Date   Chest pain    Hypertension    Palpitations    Tachycardia     Past Surgical History:  Procedure Laterality Date   WISDOM TOOTH EXTRACTION      Current Medications: No outpatient medications have been marked as taking for the 09/10/21 encounter (Appointment) with Lyn Records, MD.     Allergies:   Patient has no known allergies.   Social History   Socioeconomic History   Marital status: Single    Spouse name: Not on file   Number of children: Not on file   Years of education: Not on file   Highest education level: Not on file  Occupational History   Not on file  Tobacco Use   Smoking status: Never   Smokeless tobacco: Never  Vaping Use   Vaping Use: Never used  Substance and Sexual Activity   Alcohol use: Yes    Alcohol/week: 3.0 standard drinks    Types: 3 Cans of beer per week    Comment: 3 times weekly   Drug use: No   Sexual activity: Not on file  Other Topics Concern   Not on file  Social History Narrative   Not on file   Social Determinants of Health   Financial Resource Strain: Not on file  Food Insecurity: Not on file  Transportation Needs: Not on file  Physical Activity: Not on file  Stress: Not on file  Social Connections: Not on file     Family History: The patient's family history includes Heart attack in his maternal grandmother; Heart attack (age of onset: 27) in his father; Heart disease in his father and maternal grandmother; Parkinson's disease in his mother.  ROS:   Please see the history of present illness.    *** All other  systems reviewed and are negative.  EKGs/Labs/Other Studies Reviewed:    The following studies were reviewed today: ***  EKG:  EKG ***  Recent Labs: 05/27/2021: BUN 8; Creatinine, Ser 0.64; Hemoglobin 15.1; Platelets 325; Potassium 3.7; Sodium 140  Recent Lipid Panel    Component Value Date/Time   CHOL 201 (H) 08/03/2017 1047   TRIG 90 08/03/2017 1047   HDL 80 08/03/2017 1047   CHOLHDL 2.5 08/03/2017 1047   CHOLHDL 3 03/21/2012 1015   VLDL 10.0 03/21/2012 1015   LDLCALC 103 (H) 08/03/2017 1047   LDLDIRECT 130.6 03/21/2012 1015    Physical Exam:    VS:  There were no vitals taken for this visit.    Wt Readings from Last 3 Encounters:  05/26/21 170 lb (77.1 kg)  08/03/17 170 lb (77.1 kg)  03/21/12 165 lb 1.9 oz (74.9 kg)     GEN: ***. No acute distress HEENT: Normal NECK: No JVD. LYMPHATICS: No lymphadenopathy CARDIAC: *** murmur. RRR *** gallop, or edema. VASCULAR: *** Normal Pulses. No bruits. RESPIRATORY:  Clear to auscultation without rales, wheezing or rhonchi  ABDOMEN: Soft, non-tender, non-distended, No pulsatile mass, MUSCULOSKELETAL: No deformity  SKIN: Warm and dry NEUROLOGIC:  Alert and oriented  x 3 PSYCHIATRIC:  Normal affect   ASSESSMENT:    1. Chest pain of uncertain etiology   2. Palpitations   3. Primary hypertension    PLAN:    In order of problems listed above:  ***   Medication Adjustments/Labs and Tests Ordered: Current medicines are reviewed at length with the patient today.  Concerns regarding medicines are outlined above.  No orders of the defined types were placed in this encounter.  No orders of the defined types were placed in this encounter.   There are no Patient Instructions on file for this visit.   Signed, Lesleigh Noe, MD  09/08/2021 12:51 PM    [facility]

## 2021-09-10 ENCOUNTER — Ambulatory Visit: Payer: 59 | Admitting: Interventional Cardiology

## 2021-09-10 DIAGNOSIS — R002 Palpitations: Secondary | ICD-10-CM

## 2021-09-10 DIAGNOSIS — R079 Chest pain, unspecified: Secondary | ICD-10-CM

## 2021-09-10 DIAGNOSIS — I10 Essential (primary) hypertension: Secondary | ICD-10-CM

## 2021-10-26 ENCOUNTER — Ambulatory Visit: Payer: 59 | Admitting: Interventional Cardiology

## 2021-11-09 ENCOUNTER — Other Ambulatory Visit (HOSPITAL_BASED_OUTPATIENT_CLINIC_OR_DEPARTMENT_OTHER): Payer: Self-pay | Admitting: Family Medicine

## 2021-11-09 DIAGNOSIS — R748 Abnormal levels of other serum enzymes: Secondary | ICD-10-CM

## 2021-11-10 ENCOUNTER — Other Ambulatory Visit: Payer: Self-pay

## 2021-11-10 ENCOUNTER — Ambulatory Visit (HOSPITAL_BASED_OUTPATIENT_CLINIC_OR_DEPARTMENT_OTHER)
Admission: RE | Admit: 2021-11-10 | Discharge: 2021-11-10 | Disposition: A | Discharge status: Home / Self Care | Payer: 59 | Source: Ambulatory Visit | Attending: Family Medicine | Admitting: Family Medicine

## 2021-11-10 DIAGNOSIS — R748 Abnormal levels of other serum enzymes: Secondary | ICD-10-CM | POA: Insufficient documentation

## 2022-04-06 DIAGNOSIS — L720 Epidermal cyst: Secondary | ICD-10-CM | POA: Diagnosis not present

## 2023-06-10 DIAGNOSIS — I10 Essential (primary) hypertension: Secondary | ICD-10-CM | POA: Diagnosis not present

## 2023-06-10 DIAGNOSIS — F411 Generalized anxiety disorder: Secondary | ICD-10-CM | POA: Diagnosis not present

## 2023-06-10 DIAGNOSIS — R350 Frequency of micturition: Secondary | ICD-10-CM | POA: Diagnosis not present

## 2023-06-10 DIAGNOSIS — M79671 Pain in right foot: Secondary | ICD-10-CM | POA: Diagnosis not present

## 2023-06-10 DIAGNOSIS — E785 Hyperlipidemia, unspecified: Secondary | ICD-10-CM | POA: Diagnosis not present

## 2023-06-10 DIAGNOSIS — M21611 Bunion of right foot: Secondary | ICD-10-CM | POA: Diagnosis not present

## 2023-07-07 ENCOUNTER — Ambulatory Visit: Payer: 59 | Admitting: Podiatry

## 2023-07-07 ENCOUNTER — Ambulatory Visit (INDEPENDENT_AMBULATORY_CARE_PROVIDER_SITE_OTHER): Payer: 59

## 2023-07-07 DIAGNOSIS — M205X1 Other deformities of toe(s) (acquired), right foot: Secondary | ICD-10-CM | POA: Diagnosis not present

## 2023-07-07 DIAGNOSIS — M21621 Bunionette of right foot: Secondary | ICD-10-CM

## 2023-07-07 DIAGNOSIS — M62461 Contracture of muscle, right lower leg: Secondary | ICD-10-CM

## 2023-07-07 DIAGNOSIS — M21611 Bunion of right foot: Secondary | ICD-10-CM | POA: Diagnosis not present

## 2023-07-07 NOTE — Patient Instructions (Signed)
Do exercises exactly as told by your health care provider and adjust them as directed. It is normal to feel mild stretching, pulling, tightness, or discomfort as you do these exercises. Stop the exercise right away if you feel sudden pain or your pain gets worse.   Stretching exercises These exercises improve the movement and flexibility of your calf muscles. These exercises may also help to relieve pain and stiffness. Standing gastroc stretch  This exercise is also called a standing calf (gastroc) stretch. Stand with your hands against a wall. Extend your left / right leg behind you, and bend your front knee slightly. Your heels should be on the floor. Keeping your heels on the floor and your back knee straight, shift your weight toward the wall. You should feel a gentle stretch in the back of your lower leg (calf). Hold this position for 10 seconds. Repeat 10 times. Complete this exercise 2 times a day. Gastroc and soleus stretch, standing This is an exercise in which you stand on a step and use your body weight to stretch your calf muscles. To do this exercise: Stand with the ball of your left / right foot on a step. The ball of your foot is on the walking surface, right under your toes. Keep your other foot firmly on the same step. Hold on to the wall, a railing, or a chair for balance. Slowly lift your other foot, allowing your body weight to press your left / right heel down over the edge of the step. You should feel a stretch in your left / right calf. Hold this position for 10 seconds. Return both feet to the step. Repeat this exercise with a slight bend in your left / right knee. Repeat 10 times. Complete this exercise 2 times a day. Strengthening exercise This exercise builds strength and endurance in your foot muscles and may help to take pressure off your heel. Endurance is the ability to use your muscles for a long time, even after they get tired. Arch lifts This exercise is  sometimes called foot intrinsics. This is an exercise in which you lift the arch part of your foot only. To do this exercise: Sit in a chair with your feet flat on the floor. Keeping your big toe and your heel on the floor, lift only your arch, which is on the inner edge of your left / right foot. Do not move your knee or scrunch your toes. This is a small movement. Hold this position for 10 seconds. Return to the starting position. Repeat 10 times. Complete this exercise 2 times a day.  

## 2023-07-08 ENCOUNTER — Other Ambulatory Visit: Payer: Self-pay | Admitting: Podiatry

## 2023-07-08 DIAGNOSIS — M21611 Bunion of right foot: Secondary | ICD-10-CM

## 2023-07-08 DIAGNOSIS — M62461 Contracture of muscle, right lower leg: Secondary | ICD-10-CM

## 2023-07-08 DIAGNOSIS — M21621 Bunionette of right foot: Secondary | ICD-10-CM

## 2023-07-08 DIAGNOSIS — M205X1 Other deformities of toe(s) (acquired), right foot: Secondary | ICD-10-CM

## 2023-07-10 ENCOUNTER — Encounter: Payer: Self-pay | Admitting: Podiatry

## 2023-07-10 NOTE — Progress Notes (Signed)
  Subjective:  Patient ID: Joseph Woodard, male    DOB: 05/03/1981,  MRN: [id number]  Chief Complaint  Patient presents with   Bunions    Pt states that he has been having bunion pain in the right foot for about 1 year now he states that he is also having burning pain on his great toe and sometimes feels like stabbing    42 y.o. male presents with the above complaint. History confirmed with patient.  Pain is present over the top of the midfoot as well as the outside of the foot and big toe joint  Objective:  Physical Exam: warm, good capillary refill, no trophic changes or ulcerative lesions, normal DP and PT pulses, normal sensory exam, and tailor's bunion deformity present, mildly tender to palpation, does have equinus contracture, dorsal spurring first MPJ   Radiographs: Multiple views x-ray of the right foot: Dorsal spurring of first MPJ with some joint space narrowing but overall maintained, tailor's bunion deformity Assessment:   1. Bunion, right   2. Tailor's bunion of right foot   3. Hallux limitus, right   4. Gastrocnemius equinus of right lower extremity      Plan:  Patient was evaluated and treated and all questions answered.  Reviewed his x-rays discussed the etiology of his pain including a hallux limitus deformity, tailor's bunion deformity and equinus contributing to extensor tendinitis.  Discussed surgical nonsurgical treatment options for all of these.  Currently minimally bothersome.  Recommended stretching exercise and NTC NSAIDs.  If not improving return to see me as needed.  Does have some decent joint space left in his great toe joint could consider cheilectomy prior to fusion.  No follow-ups on file.

## 2024-01-06 DIAGNOSIS — B379 Candidiasis, unspecified: Secondary | ICD-10-CM | POA: Diagnosis not present

## 2024-04-28 DIAGNOSIS — R9431 Abnormal electrocardiogram [ECG] [EKG]: Secondary | ICD-10-CM | POA: Diagnosis not present

## 2024-04-28 DIAGNOSIS — I959 Hypotension, unspecified: Secondary | ICD-10-CM | POA: Diagnosis not present

## 2024-04-28 DIAGNOSIS — F1010 Alcohol abuse, uncomplicated: Secondary | ICD-10-CM | POA: Diagnosis not present

## 2024-04-28 DIAGNOSIS — R000 Tachycardia, unspecified: Secondary | ICD-10-CM | POA: Diagnosis not present

## 2024-04-28 DIAGNOSIS — R112 Nausea with vomiting, unspecified: Secondary | ICD-10-CM | POA: Diagnosis not present

## 2024-04-28 DIAGNOSIS — F419 Anxiety disorder, unspecified: Secondary | ICD-10-CM | POA: Diagnosis not present

## 2024-04-28 DIAGNOSIS — R42 Dizziness and giddiness: Secondary | ICD-10-CM | POA: Diagnosis not present

## 2024-04-28 DIAGNOSIS — E876 Hypokalemia: Secondary | ICD-10-CM | POA: Diagnosis not present

## 2024-04-28 DIAGNOSIS — F10930 Alcohol use, unspecified with withdrawal, uncomplicated: Secondary | ICD-10-CM | POA: Diagnosis not present

## 2024-04-28 DIAGNOSIS — F10232 Alcohol dependence with withdrawal with perceptual disturbance: Secondary | ICD-10-CM | POA: Diagnosis not present

## 2024-04-28 DIAGNOSIS — R0789 Other chest pain: Secondary | ICD-10-CM | POA: Diagnosis not present

## 2024-04-28 DIAGNOSIS — E871 Hypo-osmolality and hyponatremia: Secondary | ICD-10-CM | POA: Diagnosis not present

## 2024-04-28 DIAGNOSIS — I10 Essential (primary) hypertension: Secondary | ICD-10-CM | POA: Diagnosis not present

## 2024-04-29 DIAGNOSIS — E871 Hypo-osmolality and hyponatremia: Secondary | ICD-10-CM | POA: Diagnosis not present

## 2024-04-29 DIAGNOSIS — R079 Chest pain, unspecified: Secondary | ICD-10-CM | POA: Diagnosis not present

## 2024-04-29 DIAGNOSIS — I10 Essential (primary) hypertension: Secondary | ICD-10-CM | POA: Diagnosis not present

## 2024-04-29 DIAGNOSIS — R0789 Other chest pain: Secondary | ICD-10-CM | POA: Diagnosis not present

## 2024-04-29 DIAGNOSIS — F10930 Alcohol use, unspecified with withdrawal, uncomplicated: Secondary | ICD-10-CM | POA: Diagnosis not present

## 2024-04-29 DIAGNOSIS — R7989 Other specified abnormal findings of blood chemistry: Secondary | ICD-10-CM | POA: Diagnosis not present

## 2024-04-29 DIAGNOSIS — F419 Anxiety disorder, unspecified: Secondary | ICD-10-CM | POA: Diagnosis not present

## 2024-04-29 DIAGNOSIS — E876 Hypokalemia: Secondary | ICD-10-CM | POA: Diagnosis not present

## 2024-04-30 DIAGNOSIS — K760 Fatty (change of) liver, not elsewhere classified: Secondary | ICD-10-CM | POA: Diagnosis not present

## 2024-04-30 DIAGNOSIS — E871 Hypo-osmolality and hyponatremia: Secondary | ICD-10-CM | POA: Diagnosis not present

## 2024-04-30 DIAGNOSIS — I10 Essential (primary) hypertension: Secondary | ICD-10-CM | POA: Diagnosis not present

## 2024-04-30 DIAGNOSIS — E876 Hypokalemia: Secondary | ICD-10-CM | POA: Diagnosis not present

## 2024-04-30 DIAGNOSIS — R0789 Other chest pain: Secondary | ICD-10-CM | POA: Diagnosis not present

## 2024-04-30 DIAGNOSIS — R7989 Other specified abnormal findings of blood chemistry: Secondary | ICD-10-CM | POA: Diagnosis not present

## 2024-04-30 DIAGNOSIS — F10930 Alcohol use, unspecified with withdrawal, uncomplicated: Secondary | ICD-10-CM | POA: Diagnosis not present

## 2024-04-30 DIAGNOSIS — F419 Anxiety disorder, unspecified: Secondary | ICD-10-CM | POA: Diagnosis not present

## 2024-05-01 DIAGNOSIS — F10930 Alcohol use, unspecified with withdrawal, uncomplicated: Secondary | ICD-10-CM | POA: Diagnosis not present

## 2024-05-16 DIAGNOSIS — I10 Essential (primary) hypertension: Secondary | ICD-10-CM | POA: Diagnosis not present

## 2024-05-16 DIAGNOSIS — F1090 Alcohol use, unspecified, uncomplicated: Secondary | ICD-10-CM | POA: Diagnosis not present

## 2024-05-16 DIAGNOSIS — F1020 Alcohol dependence, uncomplicated: Secondary | ICD-10-CM | POA: Diagnosis not present

## 2024-05-16 DIAGNOSIS — F411 Generalized anxiety disorder: Secondary | ICD-10-CM | POA: Diagnosis not present

## 2024-05-16 DIAGNOSIS — D229 Melanocytic nevi, unspecified: Secondary | ICD-10-CM | POA: Diagnosis not present

## 2024-05-17 DIAGNOSIS — F411 Generalized anxiety disorder: Secondary | ICD-10-CM | POA: Diagnosis not present

## 2024-06-04 DIAGNOSIS — F411 Generalized anxiety disorder: Secondary | ICD-10-CM | POA: Diagnosis not present

## 2024-06-04 DIAGNOSIS — F1020 Alcohol dependence, uncomplicated: Secondary | ICD-10-CM | POA: Diagnosis not present

## 2024-06-13 DIAGNOSIS — F1020 Alcohol dependence, uncomplicated: Secondary | ICD-10-CM | POA: Diagnosis not present

## 2024-06-13 DIAGNOSIS — F411 Generalized anxiety disorder: Secondary | ICD-10-CM | POA: Diagnosis not present

## 2024-06-19 DIAGNOSIS — F1020 Alcohol dependence, uncomplicated: Secondary | ICD-10-CM | POA: Diagnosis not present

## 2024-06-19 DIAGNOSIS — F411 Generalized anxiety disorder: Secondary | ICD-10-CM | POA: Diagnosis not present

## 2024-07-12 DIAGNOSIS — F411 Generalized anxiety disorder: Secondary | ICD-10-CM | POA: Diagnosis not present

## 2024-07-12 DIAGNOSIS — F1020 Alcohol dependence, uncomplicated: Secondary | ICD-10-CM | POA: Diagnosis not present

## 2024-07-19 DIAGNOSIS — F411 Generalized anxiety disorder: Secondary | ICD-10-CM | POA: Diagnosis not present

## 2024-07-19 DIAGNOSIS — F1020 Alcohol dependence, uncomplicated: Secondary | ICD-10-CM | POA: Diagnosis not present

## 2024-07-26 DIAGNOSIS — F1020 Alcohol dependence, uncomplicated: Secondary | ICD-10-CM | POA: Diagnosis not present

## 2024-07-26 DIAGNOSIS — F411 Generalized anxiety disorder: Secondary | ICD-10-CM | POA: Diagnosis not present

## 2024-08-02 DIAGNOSIS — E785 Hyperlipidemia, unspecified: Secondary | ICD-10-CM | POA: Diagnosis not present

## 2024-08-02 DIAGNOSIS — F1090 Alcohol use, unspecified, uncomplicated: Secondary | ICD-10-CM | POA: Diagnosis not present

## 2024-08-28 DIAGNOSIS — L400 Psoriasis vulgaris: Secondary | ICD-10-CM | POA: Diagnosis not present

## 2024-08-28 DIAGNOSIS — D224 Melanocytic nevi of scalp and neck: Secondary | ICD-10-CM | POA: Diagnosis not present

## 2024-08-28 DIAGNOSIS — L738 Other specified follicular disorders: Secondary | ICD-10-CM | POA: Diagnosis not present

## 2024-08-28 DIAGNOSIS — R208 Other disturbances of skin sensation: Secondary | ICD-10-CM | POA: Diagnosis not present

## 2024-08-28 DIAGNOSIS — L538 Other specified erythematous conditions: Secondary | ICD-10-CM | POA: Diagnosis not present
